# Patient Record
Sex: Male | Born: 1965 | Race: White | Hispanic: No | Marital: Single | Smoking: Never smoker
Health system: Southern US, Academic
[De-identification: ages and names within clinical notes are randomized; demographics above are authoritative.]

## PROBLEM LIST (undated history)

## (undated) DIAGNOSIS — K5190 Ulcerative colitis, unspecified, without complications: Secondary | ICD-10-CM

## (undated) DIAGNOSIS — I639 Cerebral infarction, unspecified: Secondary | ICD-10-CM

## (undated) HISTORY — PX: KNEE SURGERY: SHX244

## (undated) HISTORY — DX: Ulcerative colitis, unspecified, without complications (CMS HCC): K51.90

## (undated) HISTORY — DX: Cerebral infarction, unspecified (CMS HCC): I63.9

---

## 2002-11-02 ENCOUNTER — Other Ambulatory Visit (HOSPITAL_COMMUNITY): Payer: Self-pay | Admitting: ORTHOPEDIC, SPORTS MEDICINE

## 2021-06-05 IMAGING — MR MRI KNEE LT W/O CONTRAST
4 of 5 series · 20 of 40 positions shown · IV contrast (gadolinium)
Comparison: None available.

﻿EXAM:  16196   MRI KNEE LT W/O CONTRAST
INDICATION: Medial knee pain.
TECHNIQUE: Multiplanar multisequential MRI of the left knee joint was performed without gadolinium contrast.

[Series 5: PD fat-sat · axial · left · 4.0mm · 0.37mm/px · z∈[-106,+24]mm · 8 of 30 slices shown (1 of 3)]
[im 1/30]
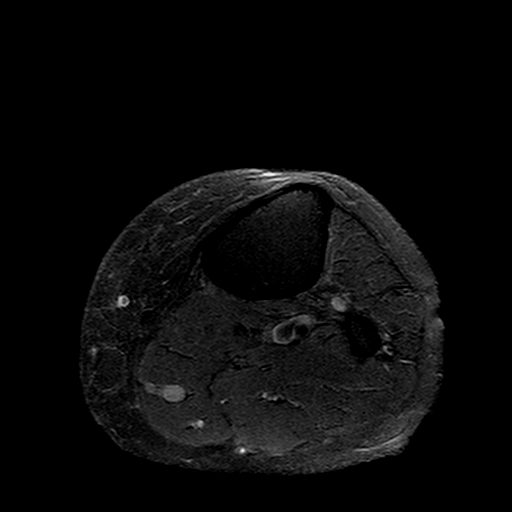
[im 5/30]
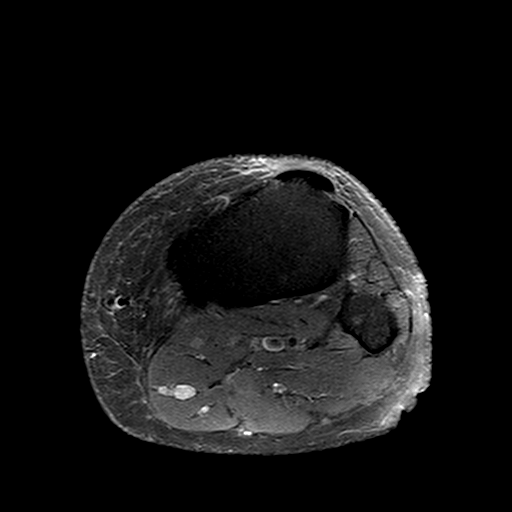
[im 9/30]
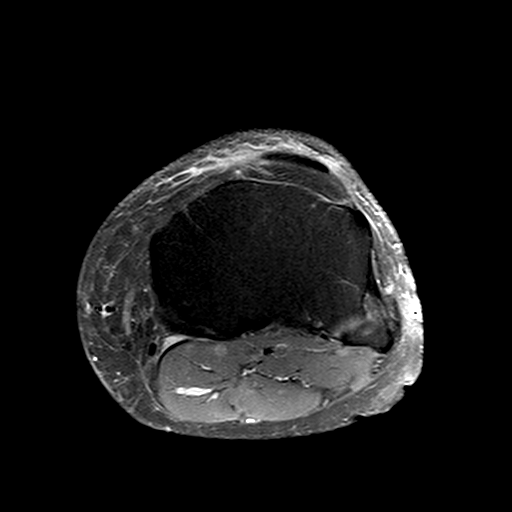
[im 13/30]
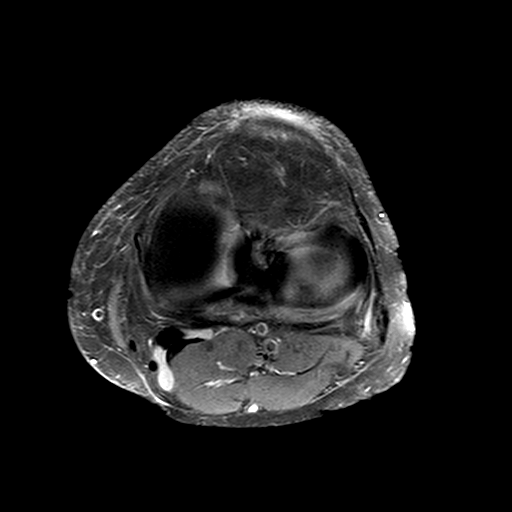
[im 17/30]
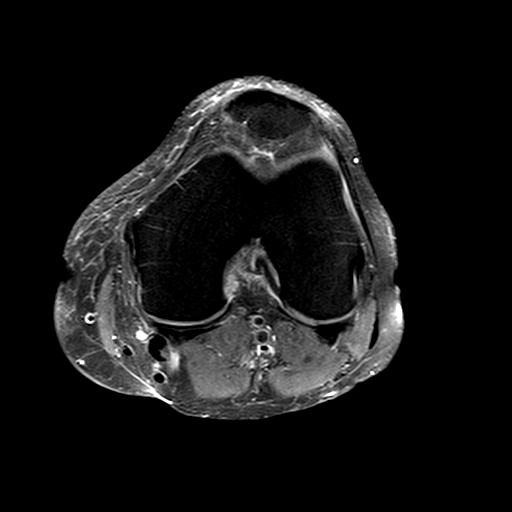
[im 21/30]
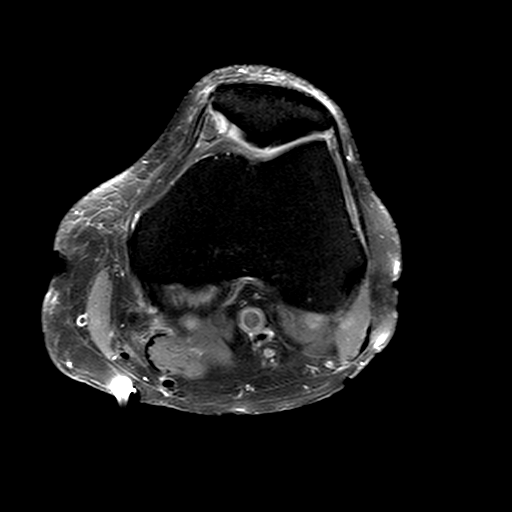
[im 25/30]
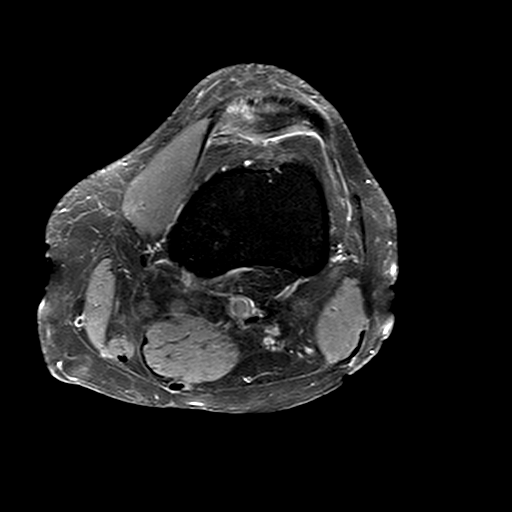
[im 30/30]
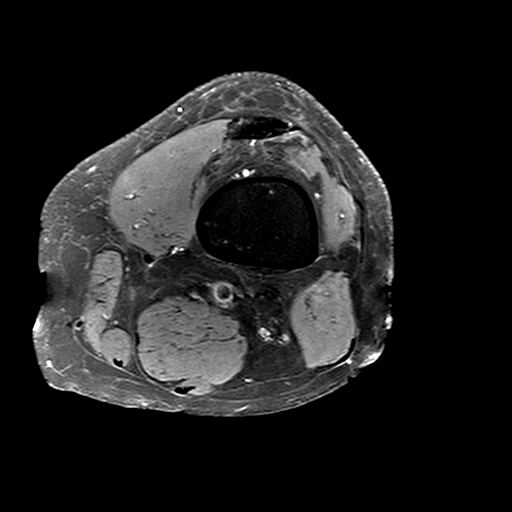

[Series 6: PD fat-sat · sagittal · left · 3.0mm · 0.31mm/px · 6 of 30 slices shown (2 of 3)]
[im 1/30]
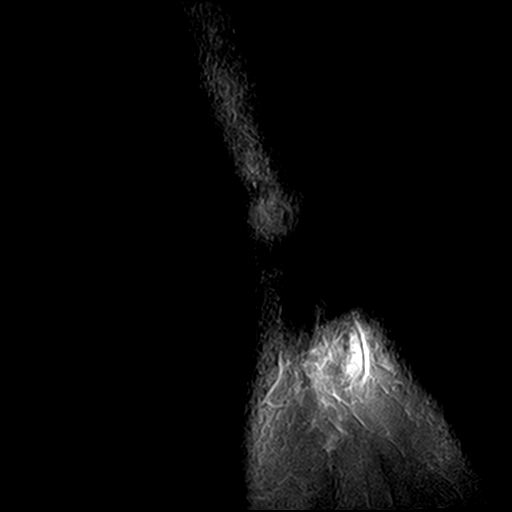
[im 5/30]
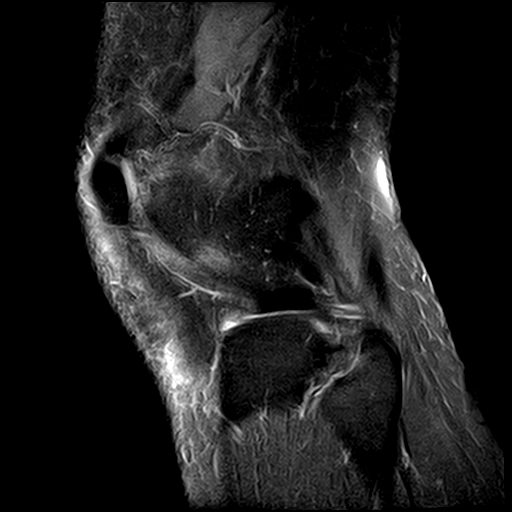
[im 9/30]
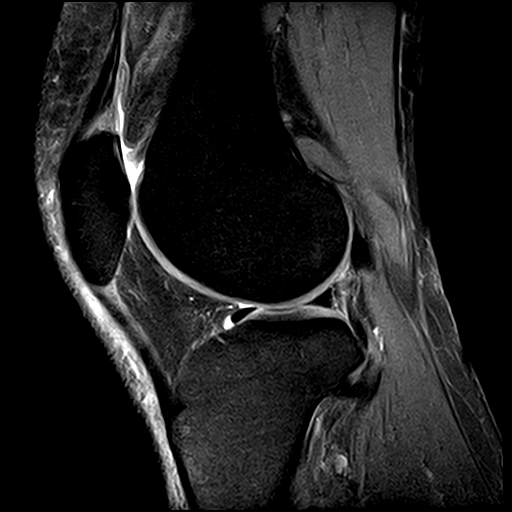
[im 13/30]
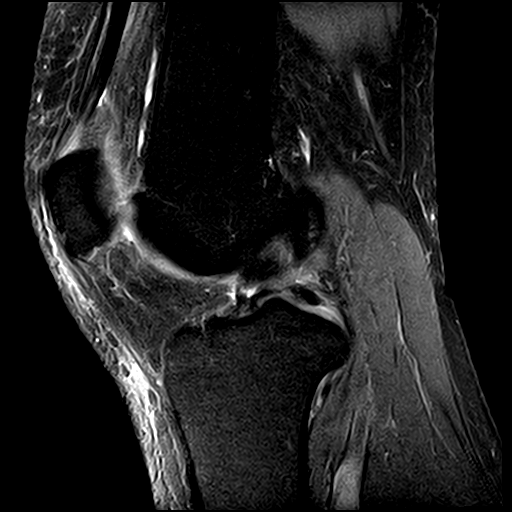
[im 17/30]
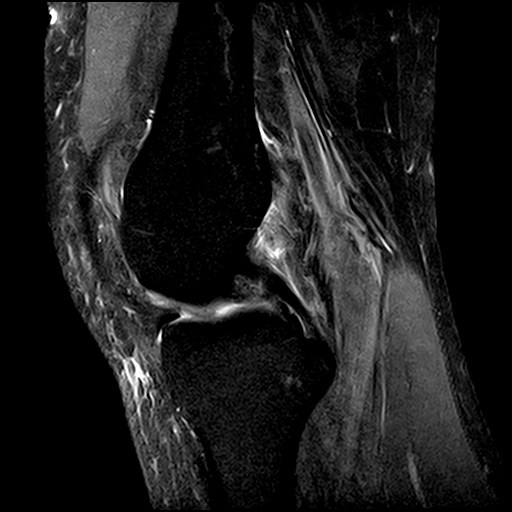
[im 25/30]
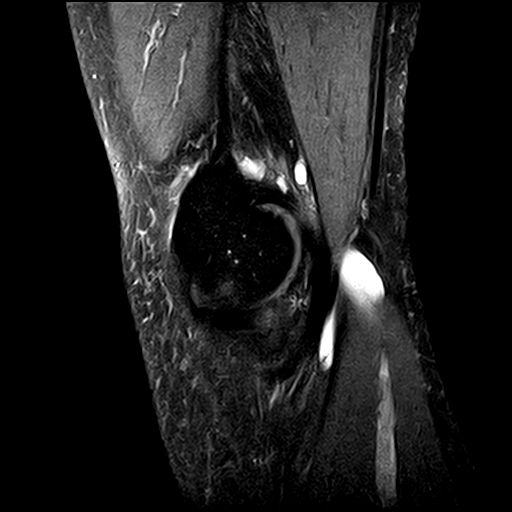

[Series 7: T1 · sagittal · left · 3.0mm · 0.31mm/px · 3 of 30 slices shown]
[im 5/30]
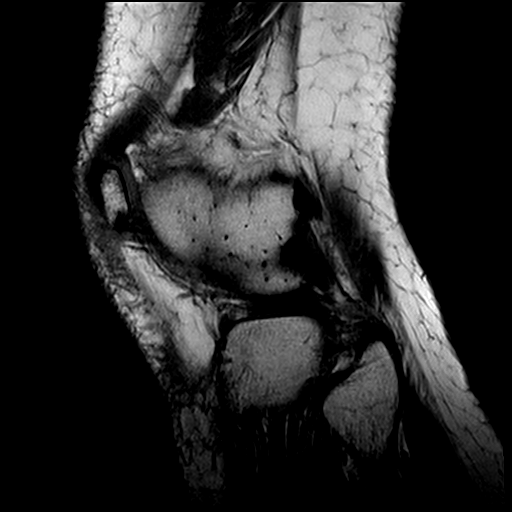
[im 17/30]
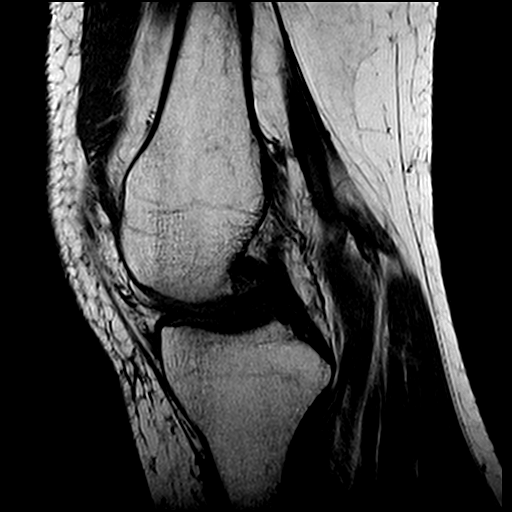
[im 25/30]
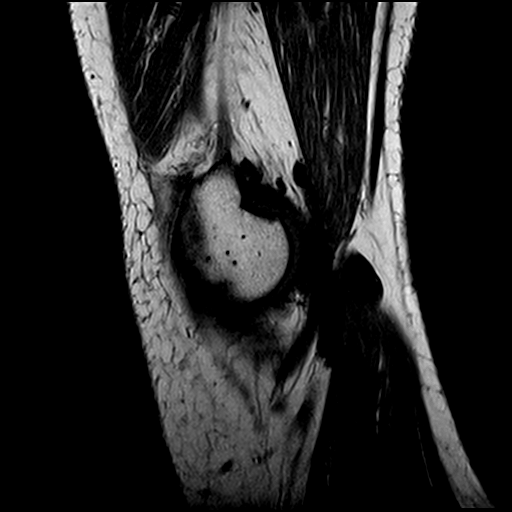

[Series 9: PD fat-sat · coronal · left · 3.0mm · 0.33mm/px · 3 of 30 slices shown (3 of 3)]
[im 5/30]
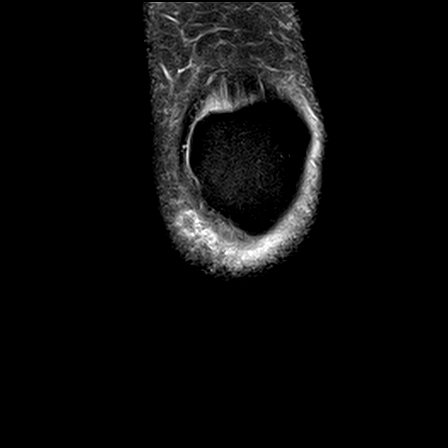
[im 17/30]
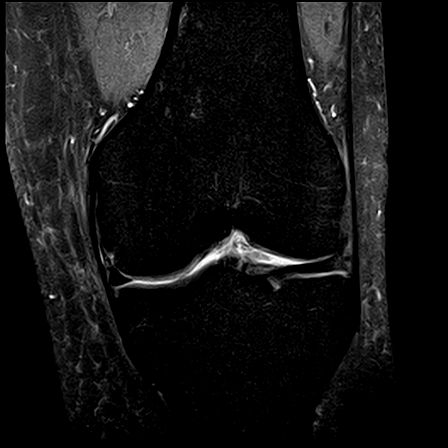
[im 25/30]
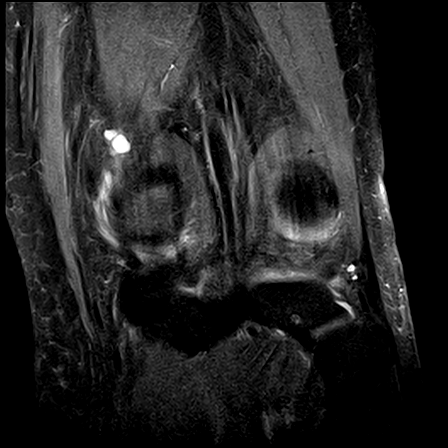

[20 of 40 positions shown; findings below may reference images not displayed]

FINDINGS: Menisci, cruciate and collateral ligaments are intact, within normal limits in morphology and signal intensity. Tibiofemoral and patellofemoral articular cartilage is well maintained. Extensor mechanism is intact. Capsular attachments appear unremarkable. Bone marrow signal intensity is normal.  There is no joint effusion.  There is a 3.5 cm Baker's cyst.  Small cysts are also noted along the dorsal aspect of the medial femoral condyle.
IMPRESSION: 1. Intact menisci, cruciate and collateral ligaments. 

2. A 3.5 cm Baker's cyst and additional small cysts along the dorsal aspect of the medial femoral condyle.

## 2021-11-12 IMAGING — DX XRAY KNEE 3 VIEWS LT
1 series · 3 of 3 positions shown · non-contrast
Comparison: None available.

﻿EXAM:  [id number]   XRAY KNEE 3 VIEWS LT
INDICATION: Left knee swelling.  History of fall.

[Series 1: apweightbearing · 0.14mm/px · 3 of 3 slices shown]
[im 1/3]
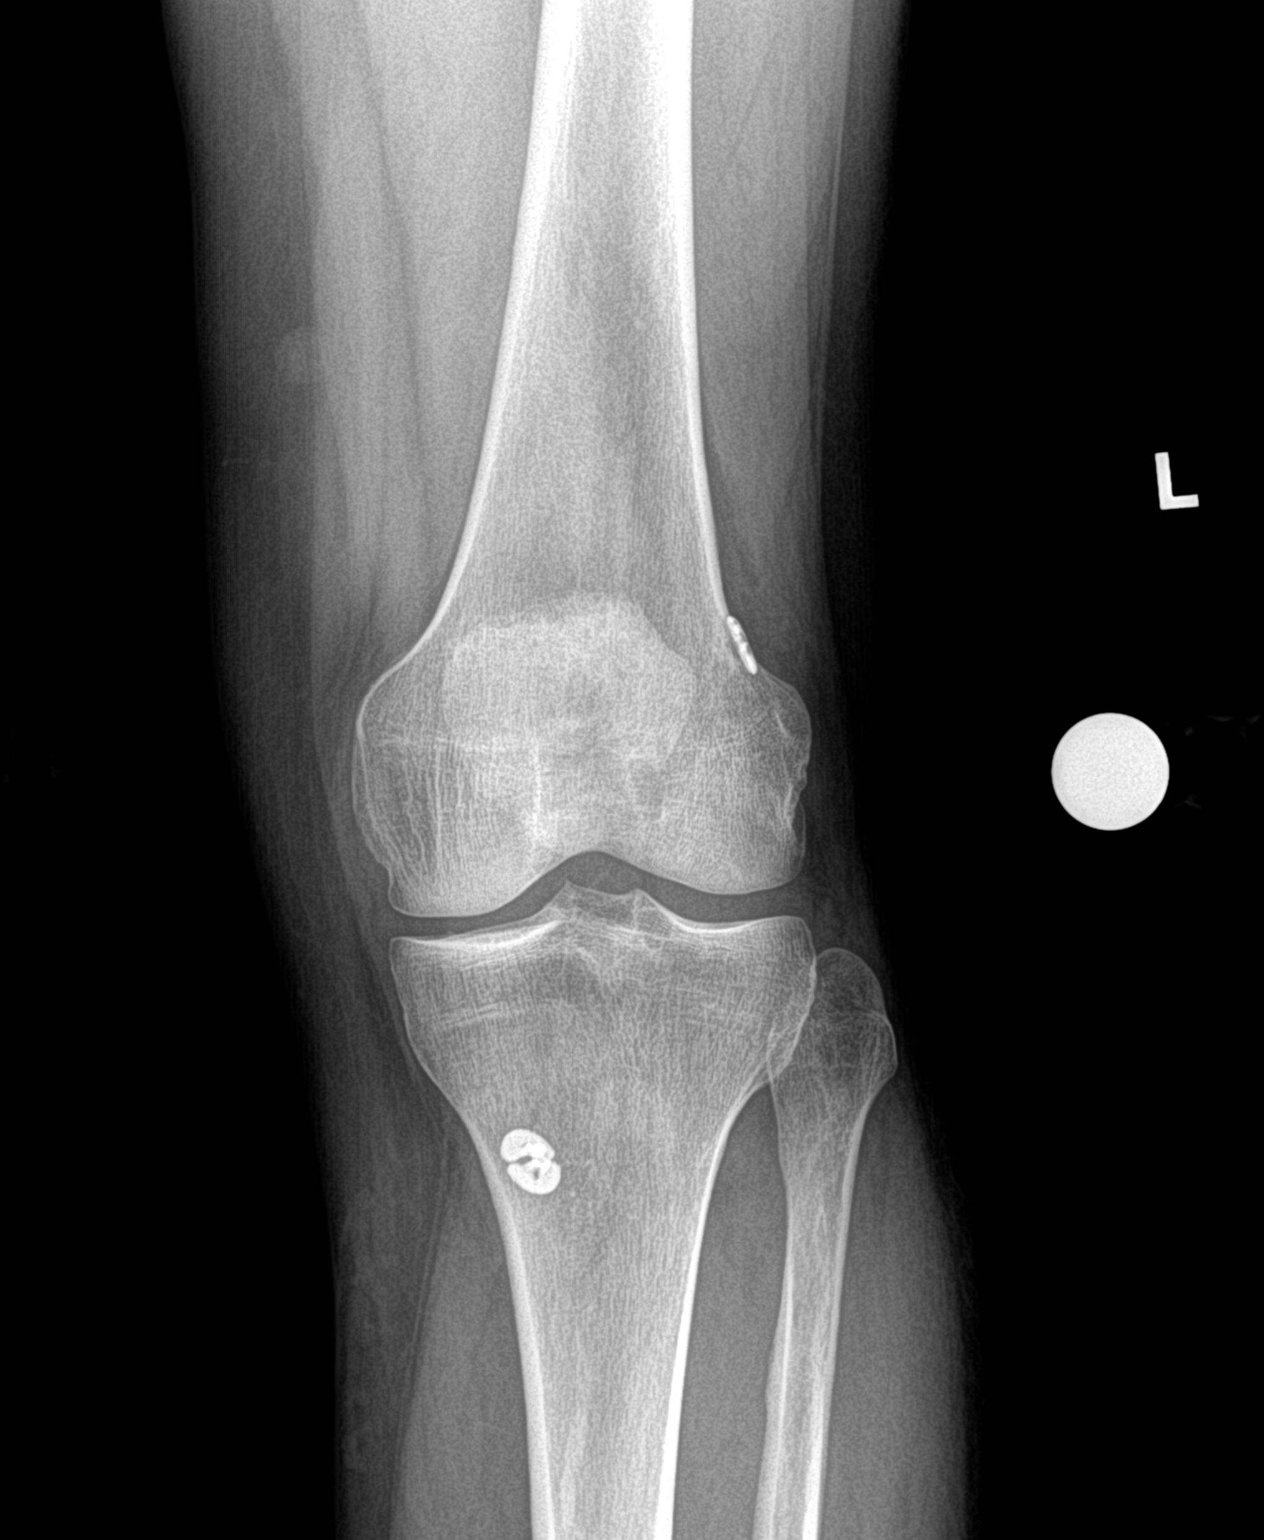
[im 2/3]
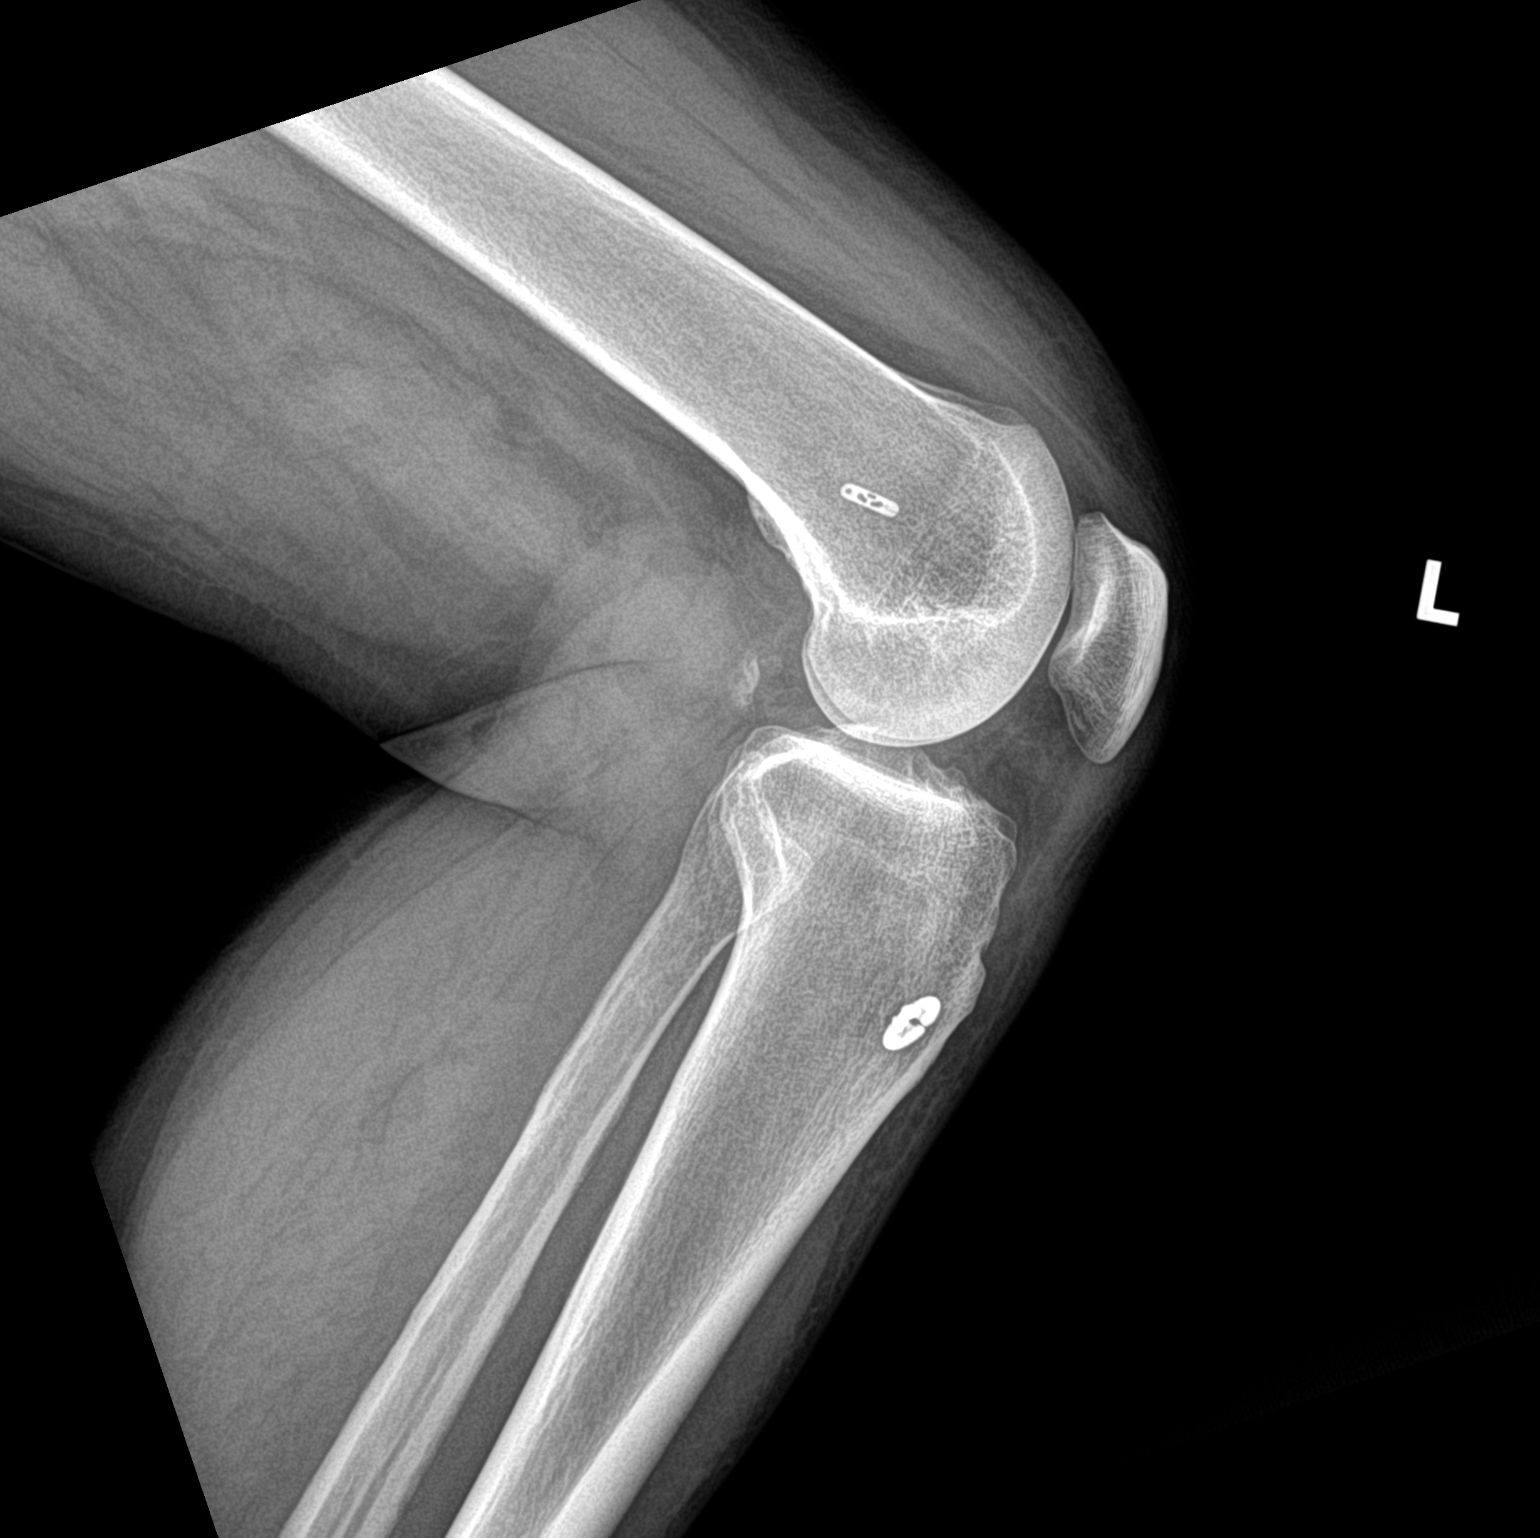
[im 3/3]
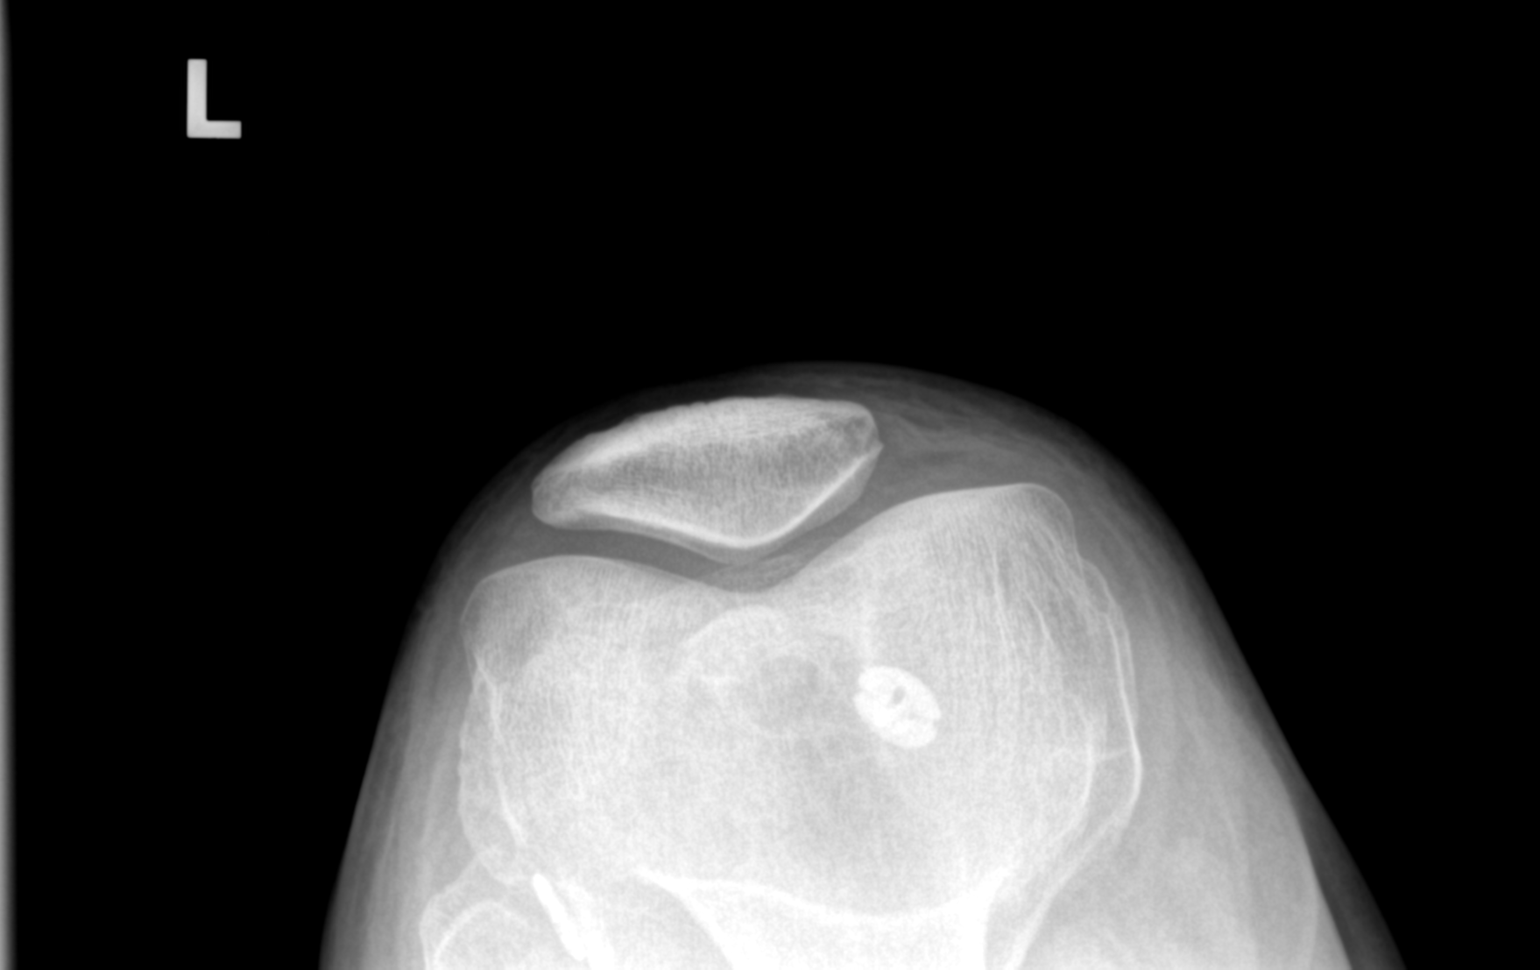

[3 of 3 positions shown; findings below may reference images not displayed]

FINDINGS: Postsurgical changes are noted compatible with ACL repair.  There is no fracture, dislocation, significant degenerative change, or lytic or blastic bony lesion. There is no joint effusion.
IMPRESSION: Postsurgical changes.

## 2022-01-27 ENCOUNTER — Emergency Department (HOSPITAL_BASED_OUTPATIENT_CLINIC_OR_DEPARTMENT_OTHER): Payer: Managed Care, Other (non HMO)

## 2022-01-27 ENCOUNTER — Encounter (HOSPITAL_BASED_OUTPATIENT_CLINIC_OR_DEPARTMENT_OTHER): Payer: Self-pay

## 2022-01-27 ENCOUNTER — Emergency Department
Admission: EM | Admit: 2022-01-27 | Discharge: 2022-01-27 | Disposition: A | Discharge status: Home / Self Care | Payer: Managed Care, Other (non HMO) | Attending: Family | Admitting: Family

## 2022-01-27 ENCOUNTER — Other Ambulatory Visit: Payer: Self-pay

## 2022-01-27 DIAGNOSIS — E785 Hyperlipidemia, unspecified: Secondary | ICD-10-CM | POA: Insufficient documentation

## 2022-01-27 DIAGNOSIS — S0031XA Abrasion of nose, initial encounter: Secondary | ICD-10-CM | POA: Insufficient documentation

## 2022-01-27 DIAGNOSIS — I10 Essential (primary) hypertension: Secondary | ICD-10-CM | POA: Insufficient documentation

## 2022-01-27 DIAGNOSIS — R519 Headache, unspecified: Secondary | ICD-10-CM | POA: Insufficient documentation

## 2022-01-27 DIAGNOSIS — W19XXXA Unspecified fall, initial encounter: Secondary | ICD-10-CM | POA: Insufficient documentation

## 2022-01-27 DIAGNOSIS — R55 Syncope and collapse: Secondary | ICD-10-CM | POA: Insufficient documentation

## 2022-01-27 DIAGNOSIS — Z8673 Personal history of transient ischemic attack (TIA), and cerebral infarction without residual deficits: Secondary | ICD-10-CM | POA: Insufficient documentation

## 2022-01-27 LAB — CBC WITH DIFF
BASOPHIL #: 0.02 10*3/uL (ref 0.00–0.30)
BASOPHIL %: 0 % (ref 0–3)
EOSINOPHIL #: 0.12 10*3/uL (ref 0.00–0.80)
EOSINOPHIL %: 2 % (ref 0–7)
HCT: 48.3 % (ref 42.0–51.0)
HGB: 16.2 g/dL (ref 13.5–18.0)
LYMPHOCYTE #: 1.57 10*3/uL (ref 1.10–5.00)
LYMPHOCYTE %: 24 % — ABNORMAL LOW (ref 25–45)
MCH: 31.7 pg (ref 27.0–32.0)
MCHC: 33.6 g/dL (ref 32.0–36.0)
MCV: 94.3 fL (ref 78.0–99.0)
MONOCYTE #: 0.62 10*3/uL (ref 0.00–1.30)
MONOCYTE %: 9 % (ref 0–12)
MPV: 9 fL (ref 7.4–10.4)
NEUTROPHIL #: 4.34 10*3/uL (ref 1.80–8.40)
NEUTROPHIL %: 65 % (ref 40–76)
PLATELETS: 275 10*3/uL (ref 140–440)
RBC: 5.12 10*6/uL (ref 4.20–6.00)
RDW: 14.5 % (ref 11.6–14.8)
WBC: 6.7 10*3/uL (ref 4.0–10.5)

## 2022-01-27 LAB — URINE DRUG SCREEN
AMPHETAMINES URINE: NEGATIVE
BARBITURATES URINE: NEGATIVE
BENZODIAZEPINES URINE: NEGATIVE
CANNABINOIDS URINE: NEGATIVE
COCAINE METABOLITES URINE: NEGATIVE
METHADONE URINE: NEGATIVE
OPIATES URINE: NEGATIVE
PCP URINE: NEGATIVE

## 2022-01-27 LAB — COMPREHENSIVE METABOLIC PANEL, NON-FASTING
ALBUMIN/GLOBULIN RATIO: 1 (ref 0.8–1.4)
ALBUMIN: 3.9 g/dL (ref 3.4–5.0)
ALKALINE PHOSPHATASE: 62 U/L (ref 46–116)
ALT (SGPT): 34 U/L (ref <=78)
ANION GAP: 7 mmol/L (ref 4–13)
AST (SGOT): 26 U/L (ref 15–37)
BILIRUBIN TOTAL: 0.4 mg/dL (ref 0.2–1.0)
BUN/CREA RATIO: 16
BUN: 17 mg/dL (ref 7–18)
CALCIUM, CORRECTED: 9.5 mg/dL
CALCIUM: 9.4 mg/dL (ref 8.5–10.1)
CHLORIDE: 103 mmol/L (ref 98–107)
CO2 TOTAL: 26 mmol/L (ref 21–32)
CREATININE: 1.04 mg/dL (ref 0.70–1.30)
ESTIMATED GFR: 85 mL/min/{1.73_m2} (ref >59)
GLOBULIN: 4.1
GLUCOSE: 93 mg/dL (ref 74–106)
OSMOLALITY, CALCULATED: 273 mOsm/kg (ref 270–290)
POTASSIUM: 4 mmol/L (ref 3.5–5.1)
PROTEIN TOTAL: 8 g/dL (ref 6.4–8.2)
SODIUM: 136 mmol/L (ref 136–145)

## 2022-01-27 LAB — URINALYSIS, MACRO/MICRO
BILIRUBIN: NEGATIVE mg/dL
BLOOD: NEGATIVE mg/dL
GLUCOSE: NEGATIVE mg/dL
KETONES: NEGATIVE mg/dL
LEUKOCYTES: NEGATIVE WBCs/uL
NITRITE: NEGATIVE
PH: 6 (ref 4.6–8.0)
PROTEIN: NEGATIVE mg/dL
SPECIFIC GRAVITY: 1.025 (ref 1.003–1.035)
UROBILINOGEN: 0.2 mg/dL (ref 0.2–1.0)

## 2022-01-27 LAB — MAGNESIUM: MAGNESIUM: 2.1 mg/dL (ref 1.8–2.4)

## 2022-01-27 LAB — AMMONIA: AMMONIA: 11 umol/L (ref 10–54)

## 2022-01-27 LAB — TROPONIN-I: TROPONIN I: <2 ng/L (ref <20)

## 2022-01-27 LAB — ETHANOL, SERUM/PLASMA: ETHANOL: <3 mg/dL (ref <=3)

## 2022-01-27 MED ORDER — CEPHALEXIN 500 MG CAPSULE
500.0000 mg | ORAL_CAPSULE | Freq: Two times a day (BID) | ORAL | 0 refills | Status: AC
Start: 2022-01-27 — End: 2022-02-06

## 2022-01-27 NOTE — ED Nurses Note (Signed)
Provider in room to see patient and went over test results.

## 2022-01-27 NOTE — ED Triage Notes (Signed)
Pt states, "I went to the bathroom about 7 pm on Christmas night. 7 AM the next morning I woke up in the hallway . I was flat of my face and there was blood on the floor. I crawled to the couch and lay there until 5 pm. Yesterday , I tried to come to the Dr, but my vision was blurred and I was confused. I went to [facility] and they sent me here. I have had three strokes. In 2019 the same thing happened . I only lay for 5 hours that time. " Pt awake alert . Speech clear. No facial droop noted. Gait steady

## 2022-01-27 NOTE — Discharge Instructions (Addendum)
May take over-the-counter medication for pain management.    FOLLOW UP WITH THE PRIMARY CARE PROVIDER AS SOON AS POSSIBLE BUT NO LATER THAN 3 DAYS NOW.  IF NO PRIMARY CARE PROVIDER EXISTS, THEN THE PATIENT IS INSTRUCTED TO ESTABLISH CARE WITH A PRIMARY CARE PROVIDER. FOLLOW-UP WITH ANY SPECIALIST PROVIDER AS INDICATED AS SOON AS POSSIBLE BUT NO LATER THAN 3 DAYS.  NOTIFY THE PRIMARY CARE PROVIDER THAT YOU WERE IN THE EMERGENCY DEPARTMENT WITHIN 24 HOURS OF DISCHARGE TO FOLLOW-UP ON YOUR RESULTS AND/OR TREATMENTS.  RETURN TO THE EMERGENCY DEPARTMENT IMMEDIATELY IF NEEDED, NO BETTER, WORSE, NEW SYMPTOMS ARISE, OR YOU CANNOT FOLLOW-UP WITH YOUR PRIMARY CARE PROVIDER IN THE PRESCRIBED TIMEFRAME.

## 2022-01-27 NOTE — ED Nurses Note (Addendum)
Patient discharged home all instructions gone over with patient including medications with opportunity to ask questions. Patient verbalized understanding and ambulated off unit independently. Patient aware he needs to follow up with family doctor.

## 2022-01-27 NOTE — ED Provider Notes (Signed)
[facility], [facility] Emergency Department  ED Primary Provider Note  History of Present Illness   Chief Complaint   Patient presents with    Fall    Headache    Confusion     Arrival: The patient arrived by Car  Roy Peterson is a 56 y.o. male who had concerns including Fall, Headache, and Confusion.  56 year old male with past medical history CVA, hyperlipidemia, hypertension presents emergency room for evaluation of syncopal episode 2 days ago.  Patient states that he went to the bathroom and felt like he needed to get back to the couch, upon walking through the house he notes steady passed out and woke up the next day.  Patient states he had abrasion noted to his nose so he super glued it.  He states that yesterday he still feel disoriented and was unable to come to get evaluated.  Patient states he went to [facility] today and they sent him here to the emergency room.  Patient states he does have a headache with intermittent pains noted to the head in the frontal area.  He denies numbness, tingling.  He states he did have blurred vision but states that has resolved.  States on his previous strokes his only findings were drawing of the face on the left side.  He states he had no residual weakness in the lower extremities.  He denies chest pain or shortness of breath.  Patient states he has not been sick and has had no fever or cough.  He is currently asking how long it is going to take for him to get his visit completed.    Review of Systems   All other systems reviewed and are negative except as noted.    Historical Data   History Reviewed This Encounter: Medical History  Surgical History  Family History  Social History    Physical Exam   ED Triage Vitals [01/27/22 1343]   BP (Non-Invasive) (!) 153/104   Heart Rate 85   Respiratory Rate 18   Temperature 36.4 C (97.5 F)   SpO2 99 %   Weight 90.7 kg (200 lb)   Height 1.753 m (_0 )       Constitutional:  56 y.o. male who  appears in no distress. Normal color, no cyanosis.   HENT:   Head: Normocephalic and atraumatic.   Mouth/Throat: Oropharynx is clear and moist.   Nose: Abrasion noted to the bridge of the nose.  Eyes: EOMI, PERRL   Neck: Trachea midline. Neck supple.  Cardiovascular: RRR, S1, S2. No murmurs, rubs or gallops. Intact distal pulses.  Pulmonary/Chest: BS clear and equal bilaterally.  Respirations even and nonlabored.  No respiratory distress. No wheezes, rales or chest tenderness.   Abdominal: Bowel sounds present and normal. Abdomen soft, no tenderness, no rebound and no guarding.  Back: No midline spinal tenderness, no paraspinal tenderness, no CVA tenderness.           Musculoskeletal: No edema, tenderness or deformity.  Skin: warm and dry. No rash, erythema, pallor or cyanosis  Psychiatric: normal mood and affect. Behavior is normal.   Neurological: Patient keenly alert and responsive, easily able to raise eyebrows, facial muscles/expressions symmetric, speaking in fluent sentences, moving all extremities equally and fully, normal gait  Patient Data     Labs Ordered/Reviewed   CBC WITH DIFF - Abnormal; Notable for the following components:       Result Value    LYMPHOCYTE % 24 (*)  All other components within normal limits   AMMONIA - Normal    Narrative:     Above range, check for dilution.   ETHANOL, SERUM/PLASMA - Normal   MAGNESIUM - Normal   URINE DRUG SCREEN - Normal   TROPONIN-I - Normal    Narrative:     Values received on females ranging between 12-15 ng/L MUST include the next serial troponin to review changes in the delta differences as the reference range for the Access II chemistry analyzer is lower than the established reference range.     URINALYSIS, MACRO/MICRO - Normal   COMPREHENSIVE METABOLIC PANEL, NON-FASTING    Narrative:     Estimated Glomerular Filtration Rate (eGFR) is calculated using the CKD-EPI (2021) equation, intended for patients 69 years of age and older. If gender is not  documented or "unknown", there will be no eGFR calculation.   URINALYSIS WITH REFLEX MICROSCOPIC AND CULTURE IF POSITIVE    Narrative:     The following orders were created for panel order URINALYSIS WITH REFLEX MICROSCOPIC AND CULTURE IF POSITIVE.  Procedure                               Abnormality         Status                     ---------                               -----------         ------                     URINALYSIS, MACRO/MICRO[[id number]]      Normal              Final result                 Please view results for these tests on the individual orders.   CBC/DIFF    Narrative:     The following orders were created for panel order CBC/DIFF.  Procedure                               Abnormality         Status                     ---------                               -----------         ------                     CBC WITH LJQG[[id number]]                Abnormal            Final result                 Please view results for these tests on the individual orders.     CT FACIAL BONES WO IV CONTRAST   Final Result by Edi, [facility] In (12/27 1518)   SOFT TISSUE SWELLING. NO EVIDENCE OF FACIAL BONE FRACTURE.         One or more dose  reduction techniques were used (e.g., Automated exposure control, adjustment of the mA and/or kV according to patient size, use of iterative reconstruction technique).         Radiologist location ID: [id number]         CT BRAIN WO IV CONTRAST   Final Result by Edi, [facility]results In (12/27 1514)   NO ACUTE FINDINGS         One or more dose reduction techniques were used (e.g., Automated exposure control, adjustment of the mA and/or kV according to patient size, use of iterative reconstruction technique).         Radiologist location ID: [id number]         XR AP MOBILE CHEST   Final Result by Edi, [facility]results In (12/27 1441)   NO ACUTE FINDINGS.         Radiologist location ID: [place] [facility]        Medical Decision Making  Will discharge patient home  due to no significant/urgent findings requiring admission.  Neurologically he is intact.  NIHSS 0.  GCS 15.  He has no acute findings on CT scan of the head or face other than localized soft tissue swelling at the abrasion.  Patient has normal white blood cell count, his hemoglobin hematocrit are normal, troponin is negative.  Patient has a normal kidney function electrolytes, his EKG shows normal sinus rhythm at 71 beats per minute.  No ST elevation or bundle-branch blocks noted.  Patient does have abrasion to the nose a we will place him on oral antibiotics due to potential of infections since the patient did supraglottic the area.  Patient was instructed to follow up with his regular doctor in 2-3 days, increase his fluid intake, and return to the ER for problems or worsening.  Patient did verbalize understanding    Amount and/or Complexity of Data Reviewed  Labs: ordered. Decision-making details documented in ED Course.  Radiology: ordered.  ECG/medicine tests: ordered.     Details: EKG shows normal sinus rhythm at 71 beats per minute.  No ST elevation or bundle-branch blocks noted    Risk  OTC drugs.  Prescription drug management.    Critical Care  Total time providing critical care: 0 minutes      ED Course as of 01/27/22 1657   Wed Jan 27, 2022   1426 NIHSS 0            Clinical Impression   Fall (Primary)   Nasal abrasion   Headache   Syncope, unspecified syncope type       Disposition: Discharged    Current Discharge Medication List        START taking these medications    Details   cephalexin (KEFLEX) 500 mg Oral Capsule Take 1 Capsule (500 mg total) by mouth Twice daily for 10 days  Qty: 20 Capsule, Refills: 0

## 2022-01-28 DIAGNOSIS — R55 Syncope and collapse: Secondary | ICD-10-CM

## 2022-01-28 LAB — ECG 12 LEAD
Atrial Rate: 71 {beats}/min
Calculated P Axis: 32 degrees
Calculated R Axis: 16 degrees
Calculated T Axis: 35 degrees
PR Interval: 160 ms
QRS Duration: 94 ms
QT Interval: 396 ms
QTC Calculation: 430 ms
Ventricular rate: 71 {beats}/min

## 2022-03-11 IMAGING — MR MRI KNEE LT W/O CONTRAST
4 of 5 series · 26 of 40 positions shown · non-contrast
Comparison: None available.

﻿EXAM:  [id number]   MRI KNEE LT W/O CONTRAST
INDICATION: Left knee pain.
TECHNIQUE: Noncontrast multiplanar, multisequence MRI was performed.

[Series 5: PD fat-sat · axial · left · 4.0mm · 0.53mm/px · z∈[-99,+31]mm · 8 of 30 slices shown (1 of 3)]
[im 1/30]
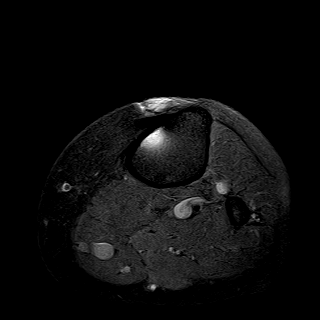
[im 5/30]
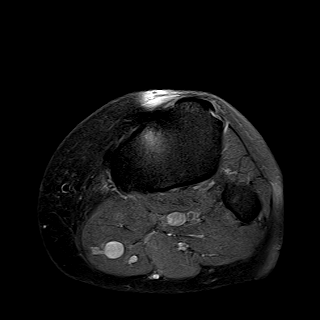
[im 9/30]
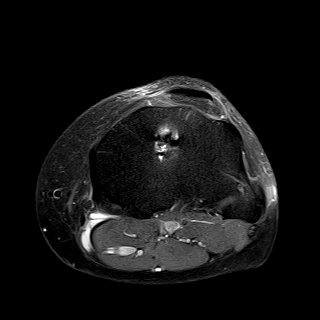
[im 13/30]
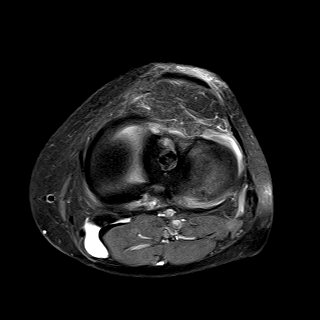
[im 17/30]
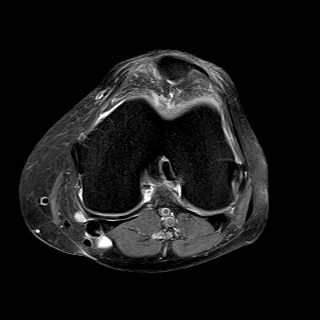
[im 21/30]
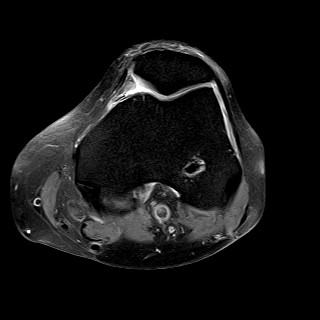
[im 25/30]
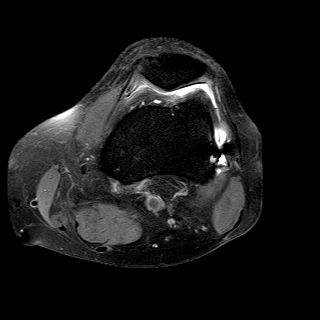
[im 30/30]
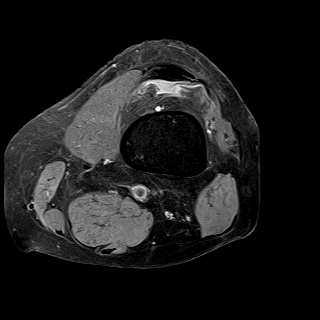

[Series 6: PD fat-sat · sagittal · left · 3.0mm · 0.31mm/px · 8 of 30 slices shown (2 of 3)]
[im 1/30]
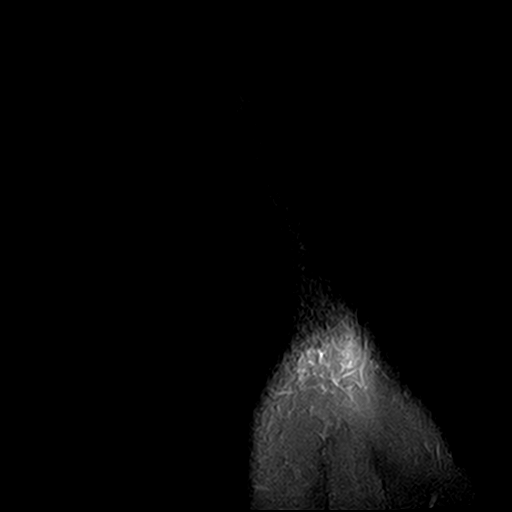
[im 5/30]
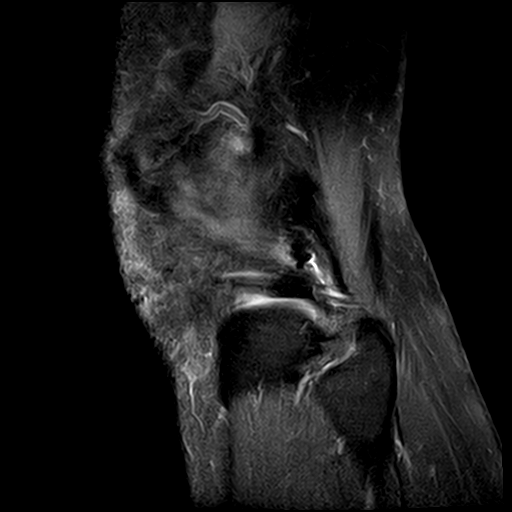
[im 9/30]
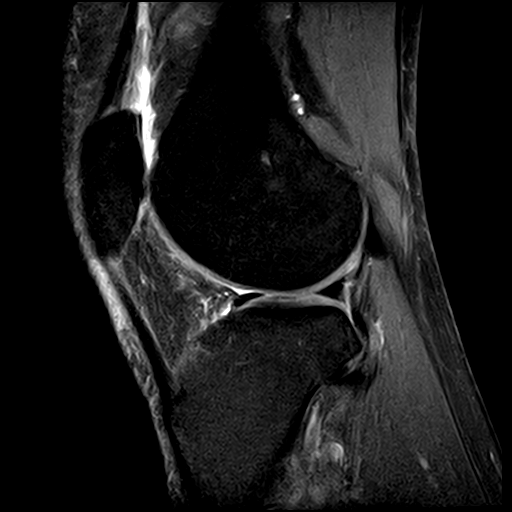
[im 13/30]
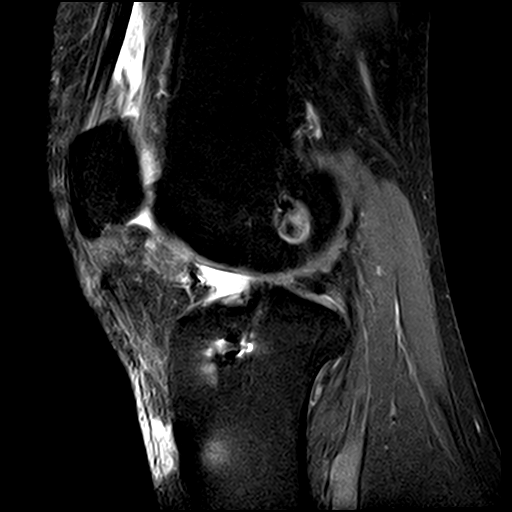
[im 17/30]
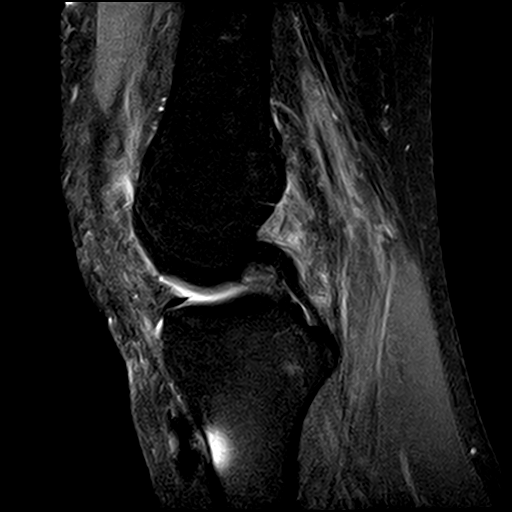
[im 21/30]
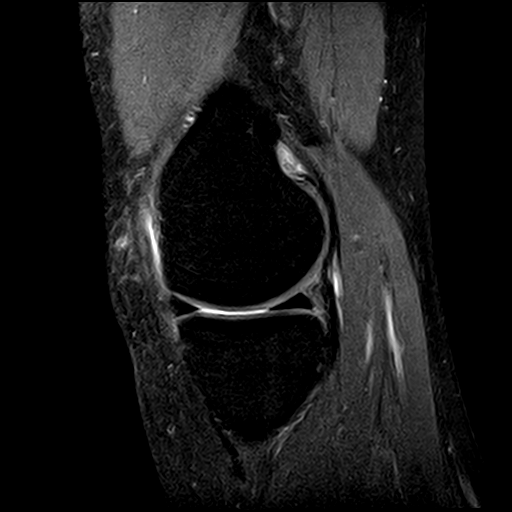
[im 25/30]
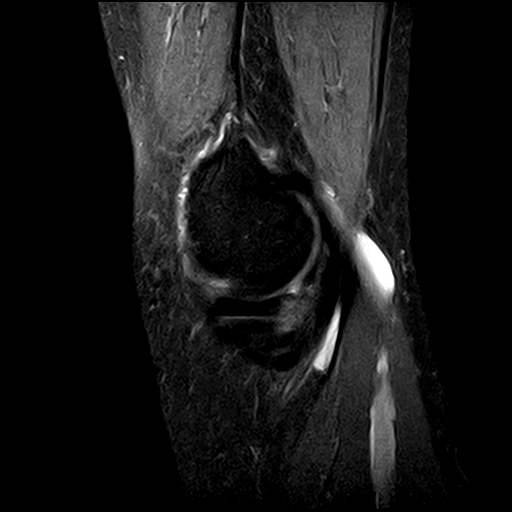
[im 30/30]
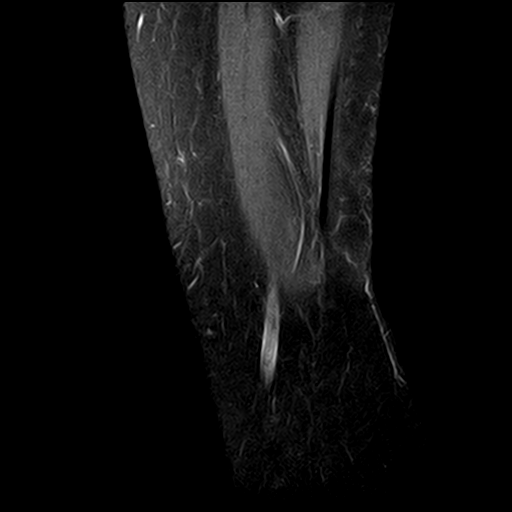

[Series 7: T1 · sagittal · left · 3.0mm · 0.31mm/px · 3 of 30 slices shown]
[im 5/30]
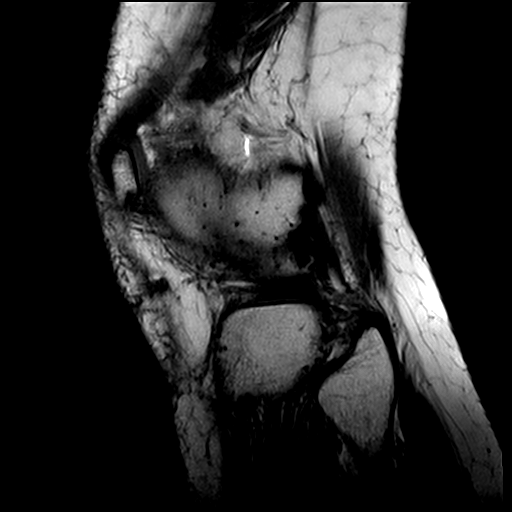
[im 17/30]
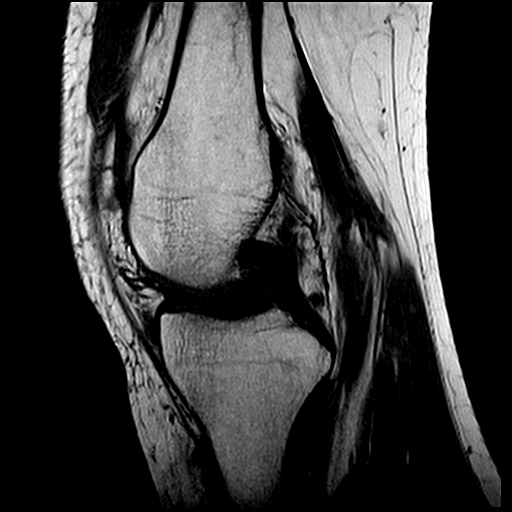
[im 25/30]
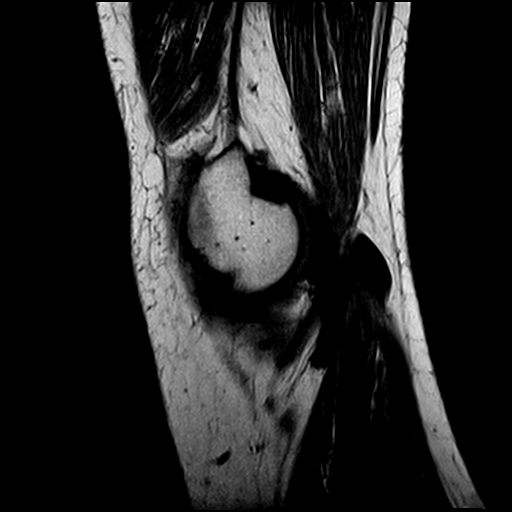

[Series 9: PD fat-sat · coronal · left · 3.0mm · 0.36mm/px · 7 of 27 slices shown (3 of 3)]
[im 1/27]
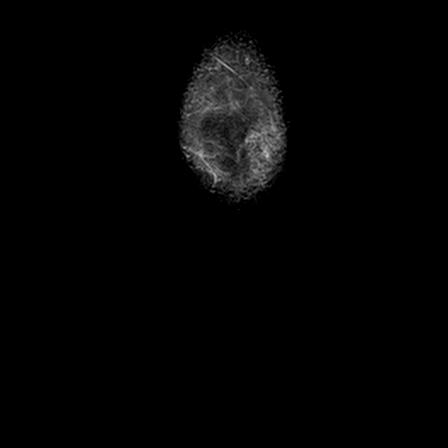
[im 4/27]
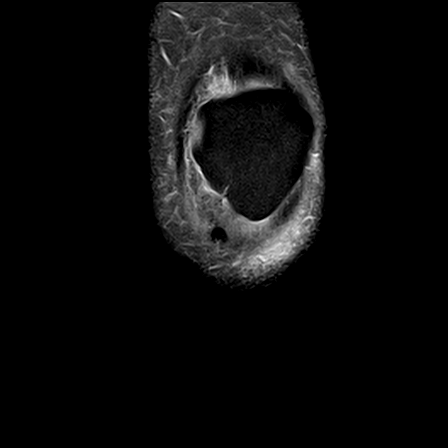
[im 8/27]
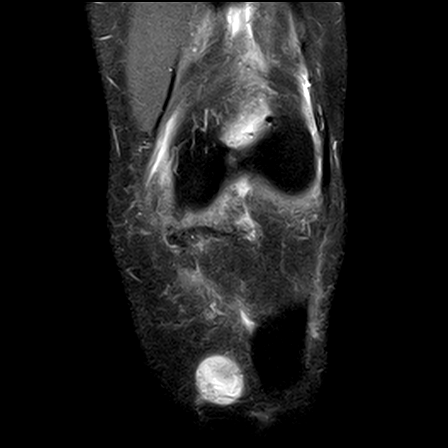
[im 12/27]
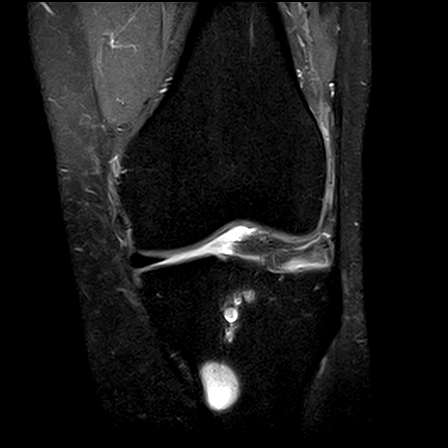
[im 15/27]
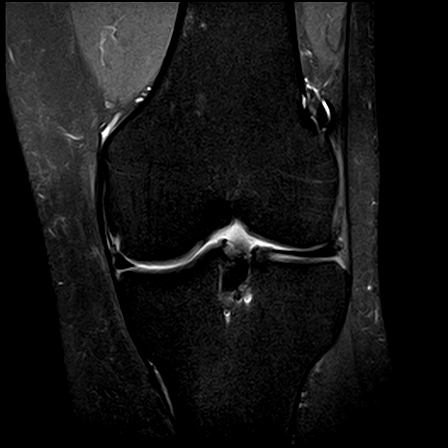
[im 19/27]
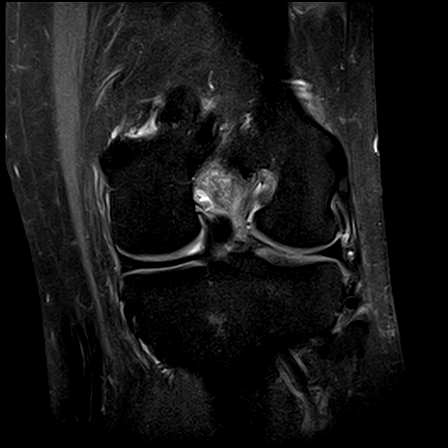
[im 23/27]
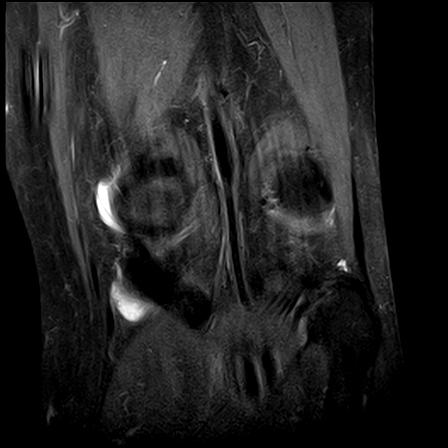

[26 of 40 positions shown; findings below may reference images not displayed]

FINDINGS: There is a small joint effusion.  

There is evidence of previous ACL repair.  The ACL graft appears intact.  

There is a small medial popliteal cyst.  

The anterior cruciate ligament, menisci, collateral ligaments, and extensor tendons appear intact.  No acute fracture or dislocation is seen.
IMPRESSION: Intact ACL graft.

## 2022-05-31 IMAGING — US US ABDOMEN COMPLETE
1 series · 14 of 25 positions shown · non-contrast
Comparison: None.

﻿EXAM: US ABDOMEN COMPLETE
INDICATION: Pain.

[Series 1: us abdomen complete · 14 of 62 slices shown]
[im 1/62]
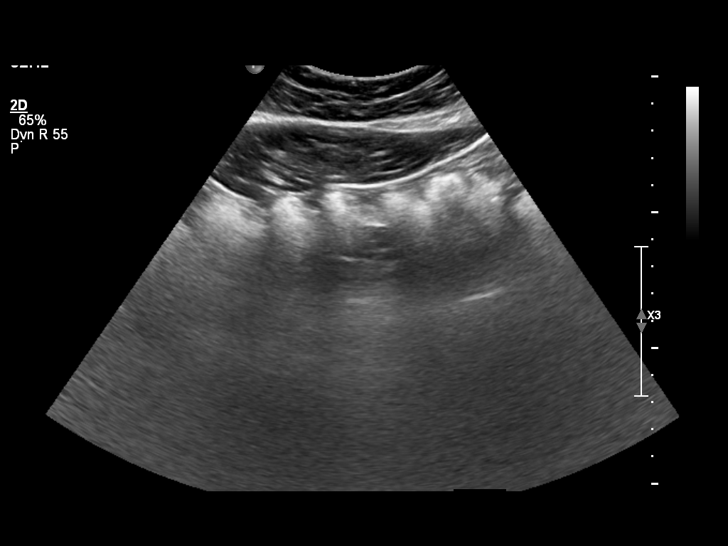
[im 6/62]
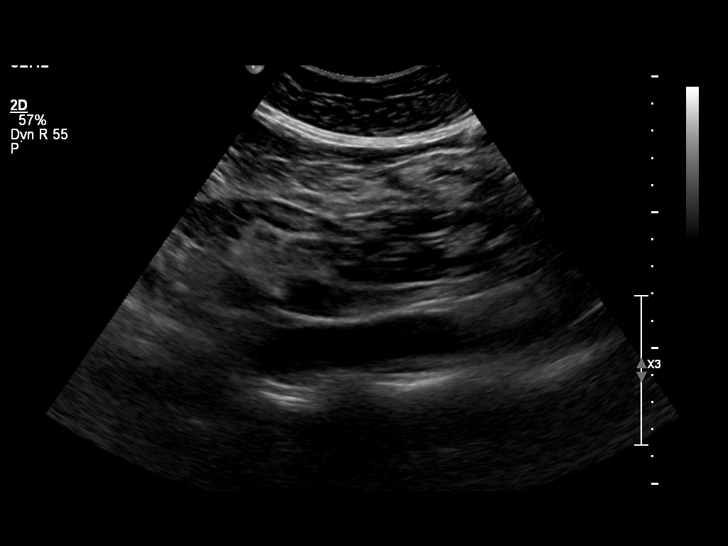
[im 11/62]
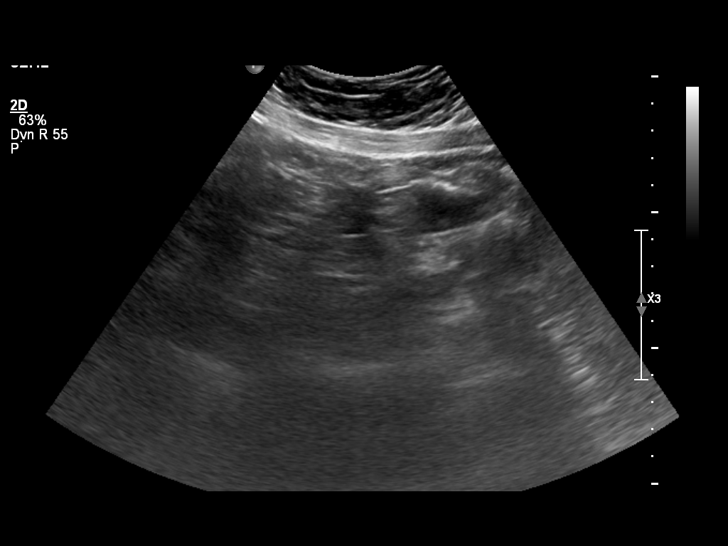
[im 16/62]
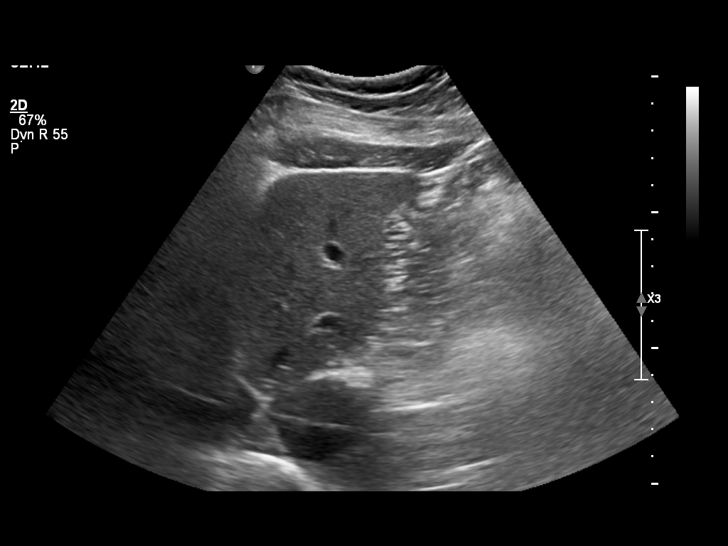
[im 21/62]
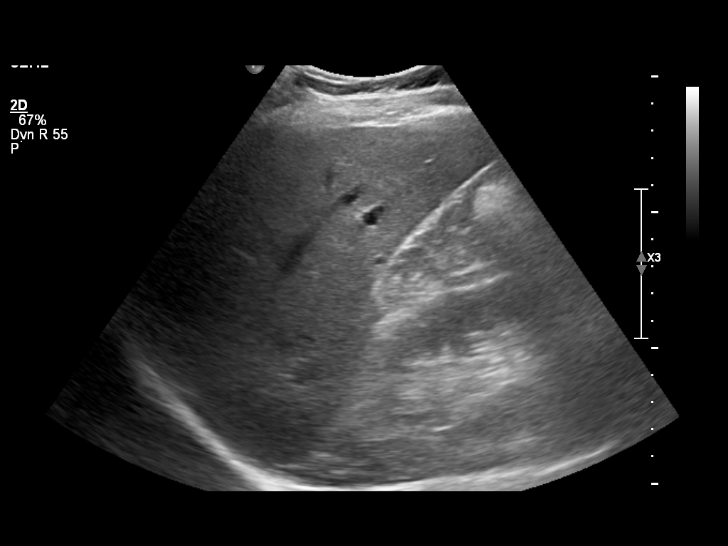
[im 23/62]
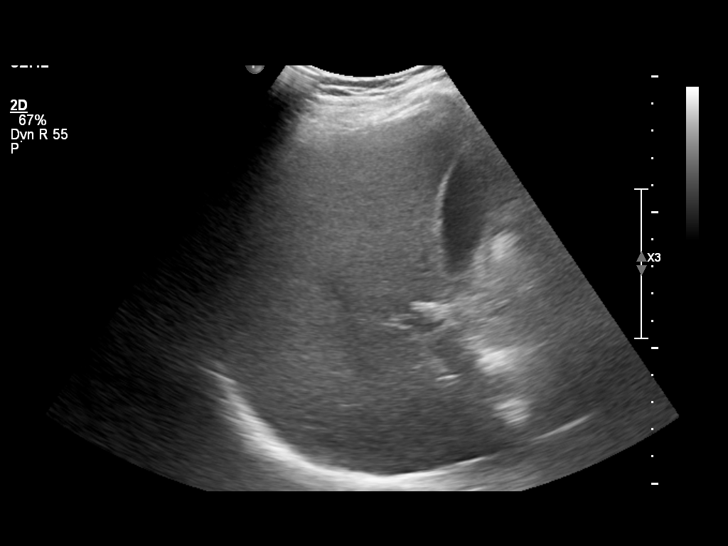
[im 28/62]
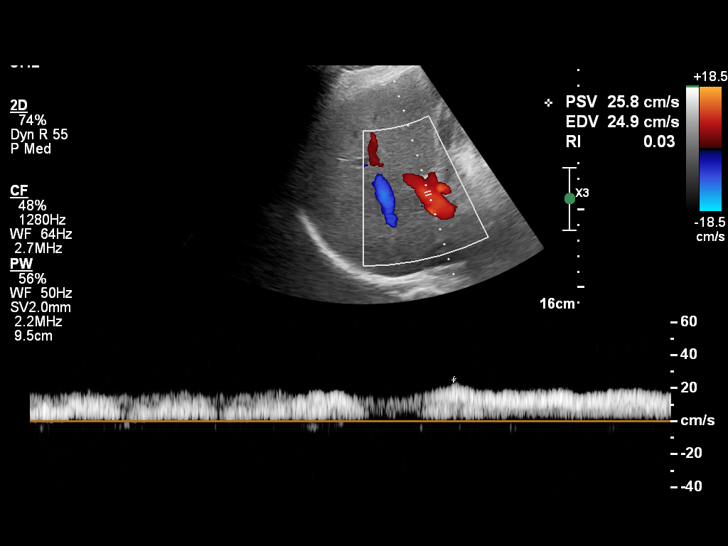
[im 34/62]
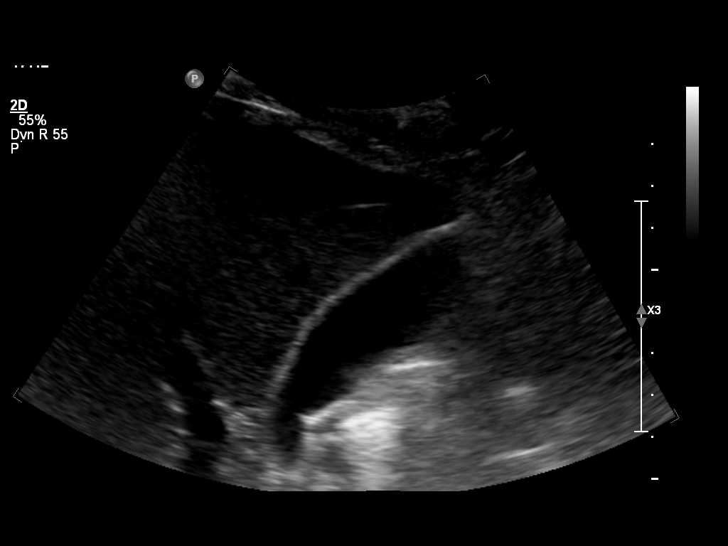
[im 39/62]
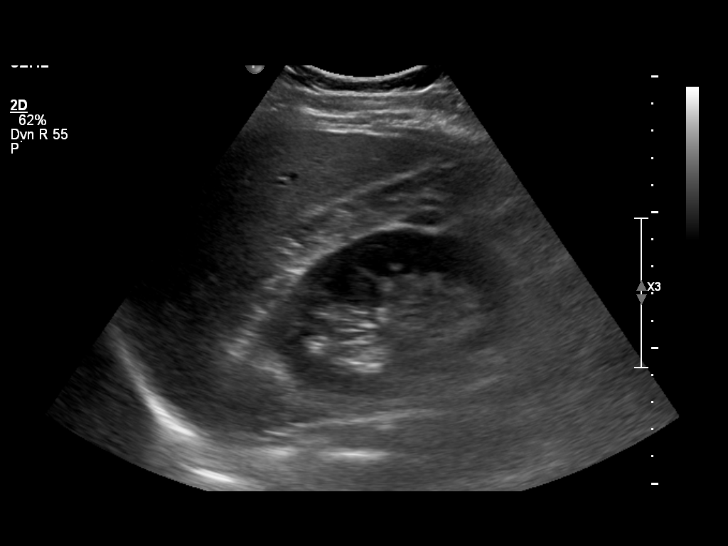
[im 41/62]
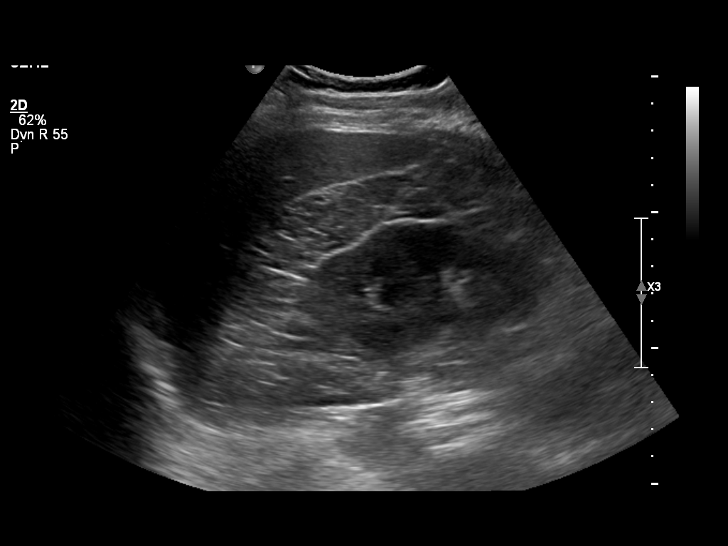
[im 46/62]
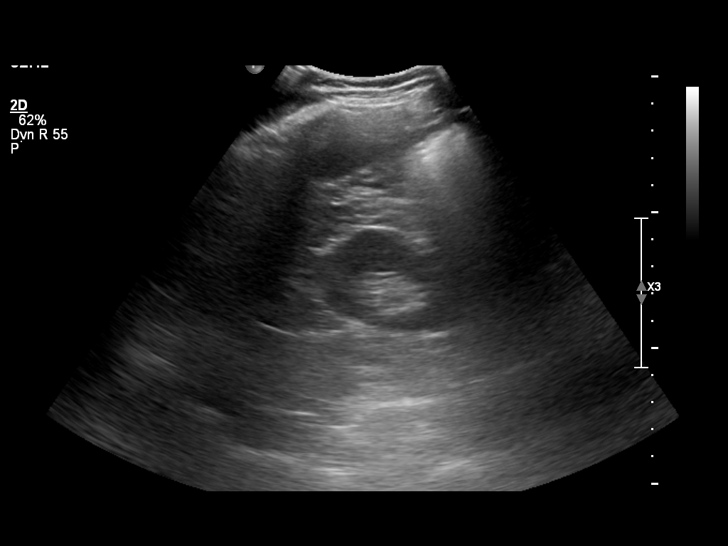
[im 51/62]
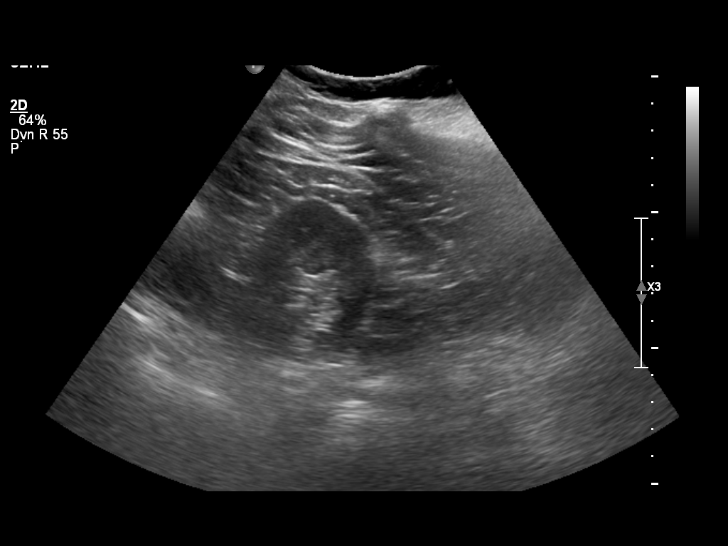
[im 56/62]
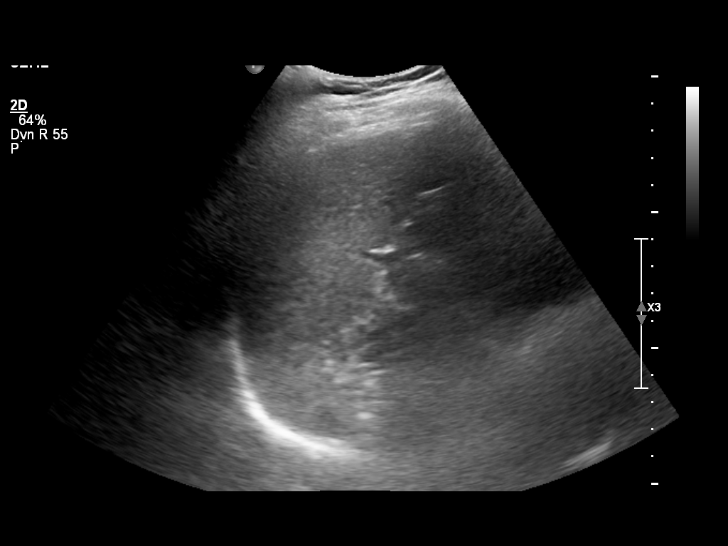
[im 62/62]
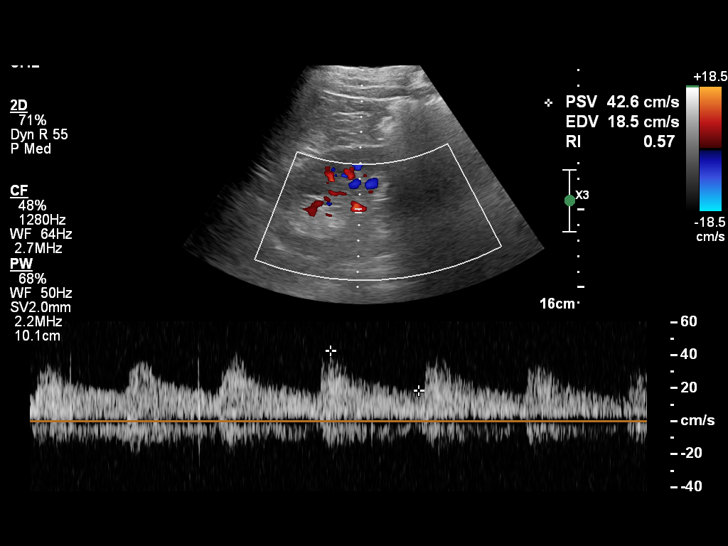

[14 of 25 positions shown; findings below may reference images not displayed]

FINDINGS: The liver appears unremarkable with no evidence of mass or biliary dilatation.  The common duct is of normal caliber.  

The gallbladder appears unremarkable with no evidence of cholelithiasis, gallbladder wall thickening, or pericholecystic fluid. 

The visualized aorta, inferior vena cava, and pancreas appear within normal limits.  Both kidneys appear unremarkable.  The spleen is not enlarged.  There is no ascites.
IMPRESSION: Normal examination.
